# Patient Record
Sex: Female | Born: 1955 | Race: White | Hispanic: No | Marital: Single | Smoking: Never smoker
Health system: Southern US, Community
[De-identification: ages and names within clinical notes are randomized; demographics above are authoritative.]

## PROBLEM LIST (undated history)

## (undated) DIAGNOSIS — E119 Type 2 diabetes mellitus without complications: Secondary | ICD-10-CM

## (undated) DIAGNOSIS — F32A Depression, unspecified: Secondary | ICD-10-CM

## (undated) DIAGNOSIS — C801 Malignant (primary) neoplasm, unspecified: Secondary | ICD-10-CM

## (undated) DIAGNOSIS — D649 Anemia, unspecified: Secondary | ICD-10-CM

## (undated) DIAGNOSIS — F329 Major depressive disorder, single episode, unspecified: Secondary | ICD-10-CM

## (undated) DIAGNOSIS — F419 Anxiety disorder, unspecified: Secondary | ICD-10-CM

## (undated) DIAGNOSIS — H269 Unspecified cataract: Secondary | ICD-10-CM

## (undated) DIAGNOSIS — E079 Disorder of thyroid, unspecified: Secondary | ICD-10-CM

## (undated) DIAGNOSIS — E785 Hyperlipidemia, unspecified: Secondary | ICD-10-CM

## (undated) DIAGNOSIS — T7840XA Allergy, unspecified, initial encounter: Secondary | ICD-10-CM

## (undated) HISTORY — DX: Malignant (primary) neoplasm, unspecified: C80.1

## (undated) HISTORY — PX: HYSTERECTOMY ABDOMINAL WITH SALPINGECTOMY: SHX6725

## (undated) HISTORY — DX: Hyperlipidemia, unspecified: E78.5

## (undated) HISTORY — DX: Major depressive disorder, single episode, unspecified: F32.9

## (undated) HISTORY — DX: Type 2 diabetes mellitus without complications: E11.9

## (undated) HISTORY — DX: Allergy, unspecified, initial encounter: T78.40XA

## (undated) HISTORY — DX: Anxiety disorder, unspecified: F41.9

## (undated) HISTORY — DX: Anemia, unspecified: D64.9

## (undated) HISTORY — DX: Disorder of thyroid, unspecified: E07.9

## (undated) HISTORY — DX: Depression, unspecified: F32.A

## (undated) HISTORY — DX: Unspecified cataract: H26.9

---

## 2017-08-23 ENCOUNTER — Other Ambulatory Visit: Payer: Self-pay | Admitting: Family Medicine

## 2017-08-23 DIAGNOSIS — N6001 Solitary cyst of right breast: Secondary | ICD-10-CM

## 2017-09-22 ENCOUNTER — Ambulatory Visit
Admission: RE | Admit: 2017-09-22 | Discharge: 2017-09-22 | Disposition: A | Discharge status: Home / Self Care | Payer: BC Managed Care – PPO | Source: Ambulatory Visit | Attending: Family Medicine | Admitting: Family Medicine

## 2017-09-22 ENCOUNTER — Ambulatory Visit: Payer: Self-pay

## 2017-09-22 DIAGNOSIS — N6001 Solitary cyst of right breast: Secondary | ICD-10-CM

## 2017-09-28 ENCOUNTER — Telehealth: Payer: Self-pay | Admitting: Gastroenterology

## 2017-09-28 NOTE — Telephone Encounter (Signed)
I reviewed the records.  (For documentation purposes - history of colon cancer in father and brother, both in their 13s.  The patient has had endometrial cancer, it is not clear if she has had genetic testing for Lynch syndrome, though this was mentioned and her most recent colonoscopy report  Colonoscopy report 03/28/2012 with diverticulosis and internal hemorrhoids, no polyps seen)   The patient is due for a colonoscopy, diagnosis: Family history of colon cancer  She can be directly booked through an [facility] nurse visit to be on my schedule. Please make the arrangements.

## 2017-09-28 NOTE — Telephone Encounter (Signed)
Records have been placed on Doctor of the day 08/17/17 Dr.Danis' desk for review.

## 2017-09-29 ENCOUNTER — Ambulatory Visit
Admission: RE | Admit: 2017-09-29 | Discharge: 2017-09-29 | Disposition: A | Discharge status: Home / Self Care | Payer: BC Managed Care – PPO | Source: Ambulatory Visit | Attending: Family Medicine | Admitting: Family Medicine

## 2017-09-29 ENCOUNTER — Encounter: Payer: Self-pay | Admitting: Gastroenterology

## 2017-09-29 DIAGNOSIS — N6001 Solitary cyst of right breast: Secondary | ICD-10-CM

## 2017-09-29 NOTE — Telephone Encounter (Signed)
Left message for patient notifying them of this and to call back and schedule appointment.

## 2017-11-09 ENCOUNTER — Other Ambulatory Visit: Payer: Self-pay

## 2017-11-09 ENCOUNTER — Ambulatory Visit (AMBULATORY_SURGERY_CENTER): Payer: Self-pay

## 2017-11-09 VITALS — Ht 68.0 in | Wt 220.0 lb

## 2017-11-09 DIAGNOSIS — Z800 Family history of malignant neoplasm of digestive organs: Secondary | ICD-10-CM

## 2017-11-09 MED ORDER — PLENVU 140 G PO SOLR: 1.0000 | Freq: Once | ORAL | 0 refills | Status: AC

## 2017-11-09 NOTE — Progress Notes (Signed)
Denies allergies to eggs or soy products. Denies complication of anesthesia or sedation. Denies use of weight loss medication. Denies use of O2.   Emmi instructions given for colonoscopy.  

## 2017-11-10 ENCOUNTER — Telehealth: Payer: Self-pay | Admitting: Gastroenterology

## 2017-11-10 MED ORDER — NA SULFATE-K SULFATE-MG SULF 17.5-3.13-1.6 GM/177ML PO SOLN: 1.0000 | Freq: Once | ORAL | 0 refills | Status: AC

## 2017-11-10 MED ORDER — PEG-KCL-NACL-NASULF-NA ASC-C 140 G PO SOLR: 1.0000 | Freq: Once | ORAL | 0 refills | Status: AC

## 2017-11-10 NOTE — Telephone Encounter (Signed)
plenvu isnt covered at all- called in [facility]- per pharmacist its a zero dollar copay- made new instructions- called pt - we discussed new prep instructions- informed her we will mail these to her, verified address, informed her suprep is no co pay- call us with questions after she gets the new prep -  Lelan Pons PV

## 2017-11-10 NOTE — Telephone Encounter (Signed)
Plenvu prep was resent to [facility] on [address]. Patient was called and notified that it should be available.   Riki Sheer, LPN ( PV )

## 2017-11-10 NOTE — Telephone Encounter (Signed)
Pharmacy calling stating the prep Plenvu is not covered by pt insurance and wanting to know if something else can be called in

## 2017-11-10 NOTE — Addendum Note (Signed)
Addended by: Steva Ready on: 11/10/2017 03:43 PM   Modules accepted: Orders

## 2017-11-27 ENCOUNTER — Encounter: Payer: BC Managed Care – PPO | Admitting: Gastroenterology

## 2017-11-28 ENCOUNTER — Encounter: Payer: Self-pay | Admitting: Gastroenterology

## 2017-12-04 ENCOUNTER — Ambulatory Visit (AMBULATORY_SURGERY_CENTER): Payer: BC Managed Care – PPO | Admitting: Gastroenterology

## 2017-12-04 ENCOUNTER — Encounter: Payer: Self-pay | Admitting: Gastroenterology

## 2017-12-04 ENCOUNTER — Telehealth: Payer: Self-pay | Admitting: Gastroenterology

## 2017-12-04 ENCOUNTER — Other Ambulatory Visit: Payer: Self-pay

## 2017-12-04 VITALS — BP 128/57 | HR 65 | Temp 98.4°F | Resp 15 | Ht 68.0 in | Wt 220.0 lb

## 2017-12-04 DIAGNOSIS — Z1211 Encounter for screening for malignant neoplasm of colon: Secondary | ICD-10-CM | POA: Diagnosis not present

## 2017-12-04 DIAGNOSIS — Z800 Family history of malignant neoplasm of digestive organs: Secondary | ICD-10-CM | POA: Diagnosis present

## 2017-12-04 DIAGNOSIS — Z1212 Encounter for screening for malignant neoplasm of rectum: Secondary | ICD-10-CM

## 2017-12-04 MED ORDER — SODIUM CHLORIDE 0.9 % IV SOLN
500.0000 mL | Freq: Once | INTRAVENOUS | Status: AC
Start: 2017-12-04 — End: ?

## 2017-12-04 NOTE — Progress Notes (Signed)
A and O x3. Report to RN. Tolerated MAC anesthesia well.

## 2017-12-04 NOTE — Progress Notes (Signed)
Pt's states no medical or surgical changes since previsit or office visit.No allergy to soy or eggs. 

## 2017-12-04 NOTE — Patient Instructions (Signed)
   INFORMATION ON DIVERTICULOSIS & HEMORRHOIDS GIVEN TO YOU TODAY  GENETICS APPOINTMENT WILL BE SET UP BY DR Loletha Carrow' OFFICE      YOU HAD AN ENDOSCOPIC PROCEDURE TODAY AT [facility]:   Refer to the procedure report that was given to you for any specific questions about what was found during the examination.  If the procedure report does not answer your questions, please call your gastroenterologist to clarify.  If you requested that your care partner not be given the details of your procedure findings, then the procedure report has been included in a sealed envelope for you to review at your convenience later.  YOU SHOULD EXPECT: Some feelings of bloating in the abdomen. Passage of more gas than usual.  Walking can help get rid of the air that was put into your GI tract during the procedure and reduce the bloating. If you had a lower endoscopy (such as a colonoscopy or flexible sigmoidoscopy) you may notice spotting of blood in your stool or on the toilet paper. If you underwent a bowel prep for your procedure, you may not have a normal bowel movement for a few days.  Please Note:  You might notice some irritation and congestion in your nose or some drainage.  This is from the oxygen used during your procedure.  There is no need for concern and it should clear up in a day or so.  SYMPTOMS TO REPORT IMMEDIATELY:   Following lower endoscopy (colonoscopy or flexible sigmoidoscopy):  Excessive amounts of blood in the stool  Significant tenderness or worsening of abdominal pains  Swelling of the abdomen that is new, acute  Fever of 100F or higher    For urgent or emergent issues, a gastroenterologist can be reached at any hour by calling [phone].   DIET:  We do recommend a small meal at first, but then you may proceed to your regular diet.  Drink plenty of fluids but you should avoid alcoholic beverages for 24 hours.  ACTIVITY:  You should plan to take it easy for the  rest of today and you should NOT DRIVE or use heavy machinery until tomorrow (because of the sedation medicines used during the test).    FOLLOW UP: Our staff will call the number listed on your records the next business day following your procedure to check on you and address any questions or concerns that you may have regarding the information given to you following your procedure. If we do not reach you, we will leave a message.  However, if you are feeling well and you are not experiencing any problems, there is no need to return our call.  We will assume that you have returned to your regular daily activities without incident.  If any biopsies were taken you will be contacted by phone or by letter within the next 1-3 weeks.  Please call us at [phone] if you have not heard about the biopsies in 3 weeks.    SIGNATURES/CONFIDENTIALITY: You and/or your care partner have signed paperwork which will be entered into your electronic medical record.  These signatures attest to the fact that that the information above on your After Visit Summary has been reviewed and is understood.  Full responsibility of the confidentiality of this discharge information lies with you and/or your care-partner.

## 2017-12-04 NOTE — Telephone Encounter (Signed)
Please send a referral to the genetic counselor at [facility] for family history of colon cancer.  - HD

## 2017-12-04 NOTE — Op Note (Signed)
[facility] Patient Name: Alejandra Wright Procedure Date: 12/04/2017 8:50 AM MRN: [id number] Endoscopist: [place]. Loletha Carrow , MD Age: 62 Referring MD:  Date of Birth: 1956-01-22 Gender: Female Account #: [id number] Procedure:                Colonoscopy Indications:              Screening patient at increased risk: Colorectal                            cancer in multiple 1st-degree relatives before age                            12 years (father and brother). patient has also had                            endometrial cancer Medicines:                Monitored Anesthesia Care Procedure:                Pre-Anesthesia Assessment:                           - Prior to the procedure, a History and Physical                            was performed, and patient medications and                            allergies were reviewed. The patient's tolerance of                            previous anesthesia was also reviewed. The risks                            and benefits of the procedure and the sedation                            options and risks were discussed with the patient.                            All questions were answered, and informed consent                            was obtained. Prior Anticoagulants: The patient has                            taken no previous anticoagulant or antiplatelet                            agents. ASA Grade Assessment: II - A patient with                            mild systemic disease. After reviewing the risks  and benefits, the patient was deemed in                            satisfactory condition to undergo the procedure.                           After obtaining informed consent, the colonoscope                            was passed under direct vision. Throughout the                            procedure, the patient's blood pressure, pulse, and                            oxygen saturations were monitored  continuously. The                            Colonoscope was introduced through the anus and                            advanced to the the cecum, identified by                            appendiceal orifice and ileocecal valve. The                            colonoscopy was performed without difficulty. The                            patient tolerated the procedure well. The quality                            of the bowel preparation was excellent. The                            ileocecal valve, appendiceal orifice, and rectum                            were photographed. The quality of the bowel                            preparation was evaluated using the BBPS [facility]                            Bowel Preparation Scale) with scores of: Right                            Colon = 3, Transverse Colon = 3 and Left Colon = 3                            (entire mucosa seen well with no residual staining,  small fragments of stool or opaque liquid). The                            total BBPS score equals 9. The bowel preparation                            used was Plenvu. Scope In: 8:59:57 AM Scope Out: 9:13:49 AM Scope Withdrawal Time: 0 hours 10 minutes 21 seconds  Total Procedure Duration: 0 hours 13 minutes 52 seconds  Findings:                 The perianal and digital rectal examinations were                            normal.                           Multiple diverticula were found in the left colon.                           Internal hemorrhoids were found. The hemorrhoids                            were Grade I (internal hemorrhoids that do not                            prolapse).                           The exam was otherwise without abnormality on                            direct and retroflexion views. Complications:            No immediate complications. Estimated Blood Loss:     Estimated blood loss: none. Impression:               - Diverticulosis in  the left colon.                           - Internal hemorrhoids.                           - The examination was otherwise normal on direct                            and retroflexion views.                           - No specimens collected. Recommendation:           - Patient has a contact number available for                            emergencies. The signs and symptoms of potential                            delayed complications were  discussed with the                            patient. Return to normal activities tomorrow.                            Written discharge instructions were provided to the                            patient.                           - Resume previous diet.                           - Continue present medications.                           - Repeat colonoscopy in 5 years for screening                            purposes (unless results of genetic testing                            indicate that it should be sooner).                           - Refer to a genetics counselor at appointment to                            be scheduled. Henry L. Loletha Carrow, MD 12/04/2017 9:18:11 AM This report has been signed electronically.

## 2017-12-05 ENCOUNTER — Telehealth: Payer: Self-pay | Admitting: *Deleted

## 2017-12-05 NOTE — Telephone Encounter (Signed)
Attempted second follow up call, no answer and no answering machine.

## 2017-12-05 NOTE — Telephone Encounter (Signed)
Attempted to call patient for post procedure call back will attempt to call back later this afternoon. Sm

## 2017-12-06 ENCOUNTER — Other Ambulatory Visit: Payer: Self-pay

## 2017-12-06 DIAGNOSIS — Z800 Family history of malignant neoplasm of digestive organs: Secondary | ICD-10-CM

## 2017-12-06 NOTE — Telephone Encounter (Signed)
Referral sent to genetic counseling. Pt has been notified. Gave her the phone number to call them if she doesn't hear from them in a weeks or so.

## 2017-12-07 ENCOUNTER — Telehealth: Payer: Self-pay | Admitting: Genetics

## 2017-12-08 ENCOUNTER — Telehealth: Payer: Self-pay | Admitting: Gastroenterology

## 2017-12-08 NOTE — Telephone Encounter (Signed)
Understood 

## 2017-12-08 NOTE — Telephone Encounter (Signed)
Patient called with questions concerning insurance coverage for genetic testing as well as concerns about who owns the DNA/data after testing.  We discussed the labs privacy policy, informed her that typically health insurance coverers genetic testing when you meet national criteria (which she appears to meet).  She also expressed concerns regarding genetic discrimination.  I discussed the law [place] with her in detail, and answered questions about colon cancer risk/Lynch Syndrome.  I emailed information about [place], [facility]'s privacy policy, Colon cancer genetic testing/lynch syndrome, and a link to our website to read more about genetic counseling.  At this time she is undecided if she would like to pursue genetic counseling or not.  I told her if she does want to come for genetic counseling, we can discuss these topics further and would not actually order genetic testing unless she was comfortable.  She says she will reach out if she has questions or would like to schedule an appointment.

## 2017-12-08 NOTE — Telephone Encounter (Signed)
FYI

## 2017-12-11 NOTE — Telephone Encounter (Signed)
Pt declined the appointment and the referral to genetics counselor. Referral was closed.

## 2017-12-14 ENCOUNTER — Other Ambulatory Visit: Payer: Self-pay | Admitting: Family Medicine

## 2017-12-14 DIAGNOSIS — Z1231 Encounter for screening mammogram for malignant neoplasm of breast: Secondary | ICD-10-CM

## 2018-01-08 ENCOUNTER — Ambulatory Visit
Admission: RE | Admit: 2018-01-08 | Discharge: 2018-01-08 | Disposition: A | Discharge status: Home / Self Care | Payer: BC Managed Care – PPO | Source: Ambulatory Visit | Attending: Family Medicine | Admitting: Family Medicine

## 2018-01-08 DIAGNOSIS — Z1231 Encounter for screening mammogram for malignant neoplasm of breast: Secondary | ICD-10-CM

## 2018-08-21 ENCOUNTER — Other Ambulatory Visit: Payer: Self-pay | Admitting: Family Medicine

## 2018-08-21 DIAGNOSIS — Z1231 Encounter for screening mammogram for malignant neoplasm of breast: Secondary | ICD-10-CM

## 2019-01-14 ENCOUNTER — Ambulatory Visit: Payer: BC Managed Care – PPO

## 2019-02-06 ENCOUNTER — Ambulatory Visit: Payer: BC Managed Care – PPO

## 2019-03-12 ENCOUNTER — Ambulatory Visit: Payer: BC Managed Care – PPO

## 2019-04-24 ENCOUNTER — Ambulatory Visit: Payer: BC Managed Care – PPO

## 2019-05-18 ENCOUNTER — Ambulatory Visit: Payer: BC Managed Care – PPO

## 2019-06-20 ENCOUNTER — Other Ambulatory Visit: Payer: Self-pay

## 2019-06-20 ENCOUNTER — Ambulatory Visit
Admission: RE | Admit: 2019-06-20 | Discharge: 2019-06-20 | Disposition: A | Discharge status: Home / Self Care | Payer: BC Managed Care – PPO | Source: Ambulatory Visit | Attending: Family Medicine | Admitting: Family Medicine

## 2019-06-20 DIAGNOSIS — Z1231 Encounter for screening mammogram for malignant neoplasm of breast: Secondary | ICD-10-CM

## 2020-04-30 ENCOUNTER — Ambulatory Visit: Payer: BC Managed Care – PPO | Admitting: Podiatry

## 2020-05-14 ENCOUNTER — Ambulatory Visit: Payer: BC Managed Care – PPO | Admitting: Podiatry

## 2020-07-24 ENCOUNTER — Other Ambulatory Visit: Payer: Self-pay | Admitting: Family Medicine

## 2020-07-24 DIAGNOSIS — Z1231 Encounter for screening mammogram for malignant neoplasm of breast: Secondary | ICD-10-CM

## 2020-08-05 ENCOUNTER — Other Ambulatory Visit: Payer: Self-pay

## 2020-08-05 ENCOUNTER — Ambulatory Visit
Admission: RE | Admit: 2020-08-05 | Discharge: 2020-08-05 | Disposition: A | Discharge status: Home / Self Care | Payer: BC Managed Care – PPO | Source: Ambulatory Visit | Attending: Family Medicine | Admitting: Family Medicine

## 2020-08-05 DIAGNOSIS — Z1231 Encounter for screening mammogram for malignant neoplasm of breast: Secondary | ICD-10-CM

## 2020-08-10 ENCOUNTER — Other Ambulatory Visit: Payer: Self-pay | Admitting: Family Medicine

## 2020-08-10 DIAGNOSIS — R928 Other abnormal and inconclusive findings on diagnostic imaging of breast: Secondary | ICD-10-CM

## 2020-08-20 ENCOUNTER — Ambulatory Visit
Admission: RE | Admit: 2020-08-20 | Discharge: 2020-08-20 | Disposition: A | Discharge status: Home / Self Care | Payer: BC Managed Care – PPO | Source: Ambulatory Visit | Attending: Family Medicine | Admitting: Family Medicine

## 2020-08-20 ENCOUNTER — Other Ambulatory Visit: Payer: Self-pay | Admitting: Family Medicine

## 2020-08-20 ENCOUNTER — Other Ambulatory Visit: Payer: Self-pay

## 2020-08-20 DIAGNOSIS — R921 Mammographic calcification found on diagnostic imaging of breast: Secondary | ICD-10-CM

## 2020-08-20 DIAGNOSIS — R928 Other abnormal and inconclusive findings on diagnostic imaging of breast: Secondary | ICD-10-CM

## 2021-02-17 ENCOUNTER — Ambulatory Visit
Admission: RE | Admit: 2021-02-17 | Discharge: 2021-02-17 | Disposition: A | Discharge status: Home / Self Care | Payer: BC Managed Care – PPO | Source: Ambulatory Visit | Attending: Family Medicine | Admitting: Family Medicine

## 2021-02-17 ENCOUNTER — Other Ambulatory Visit: Payer: Self-pay

## 2021-02-17 ENCOUNTER — Other Ambulatory Visit: Payer: Self-pay | Admitting: Family Medicine

## 2021-02-17 DIAGNOSIS — R921 Mammographic calcification found on diagnostic imaging of breast: Secondary | ICD-10-CM

## 2021-07-28 ENCOUNTER — Other Ambulatory Visit: Payer: Self-pay | Admitting: Family Medicine

## 2021-07-28 DIAGNOSIS — R921 Mammographic calcification found on diagnostic imaging of breast: Secondary | ICD-10-CM

## 2021-08-24 ENCOUNTER — Ambulatory Visit
Admission: RE | Admit: 2021-08-24 | Discharge: 2021-08-24 | Disposition: A | Discharge status: Home / Self Care | Payer: BC Managed Care – PPO | Source: Ambulatory Visit | Attending: Family Medicine | Admitting: Family Medicine

## 2021-08-24 ENCOUNTER — Other Ambulatory Visit: Payer: Self-pay

## 2021-08-24 DIAGNOSIS — R921 Mammographic calcification found on diagnostic imaging of breast: Secondary | ICD-10-CM

## 2021-09-29 ENCOUNTER — Other Ambulatory Visit: Payer: Self-pay | Admitting: Family Medicine

## 2021-09-29 DIAGNOSIS — E2839 Other primary ovarian failure: Secondary | ICD-10-CM

## 2022-01-28 ENCOUNTER — Other Ambulatory Visit: Payer: Self-pay | Admitting: Family Medicine

## 2022-01-28 DIAGNOSIS — E2839 Other primary ovarian failure: Secondary | ICD-10-CM

## 2022-02-02 IMAGING — MG MM DIGITAL DIAGNOSTIC UNILAT*R* W/ TOMO W/ CAD
8 of 12 series · 8 of 28 positions shown · non-contrast
Comparison: Previous exam(s).

CLINICAL DATA: 64-year-old female presenting for six-month
follow-up of probably benign right breast calcifications.

EXAM:
DIGITAL DIAGNOSTIC UNILATERAL RIGHT MAMMOGRAM WITH TOMOSYNTHESIS AND
CAD; ULTRASOUND RIGHT BREAST LIMITED
TECHNIQUE: Right digital diagnostic mammography and breast tomosynthesis was
performed. The images were evaluated with computer-aided detection.;
Targeted ultrasound examination of the right breast was performed

[R CC (1 of 3)]
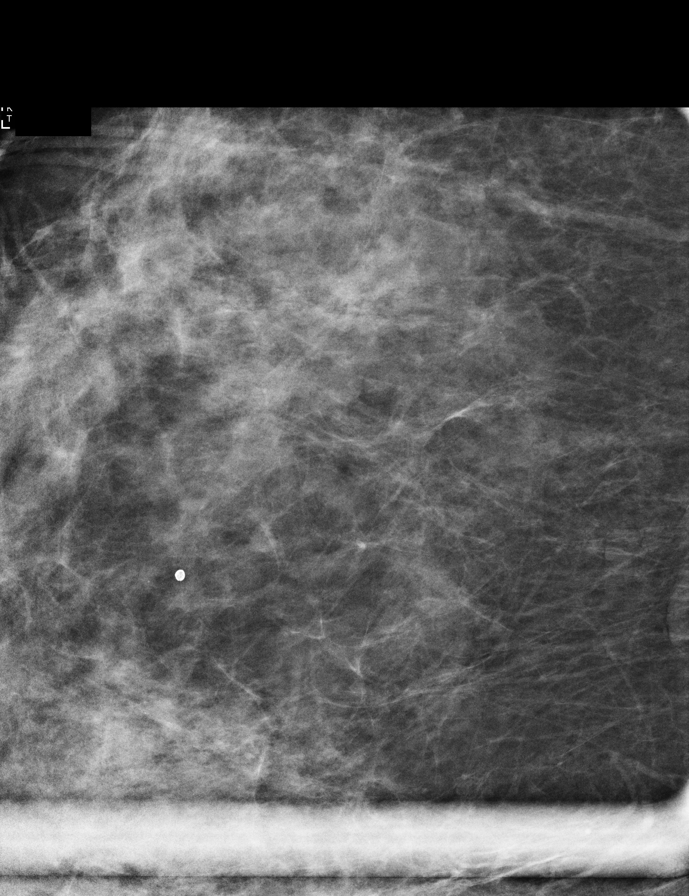

[R CC (2 of 3)]
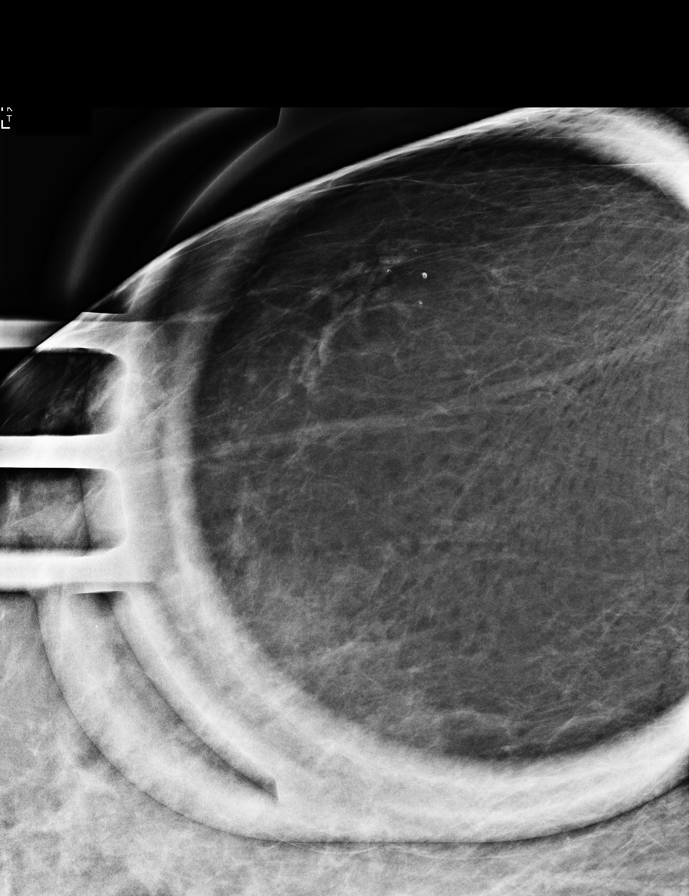

[R ML]
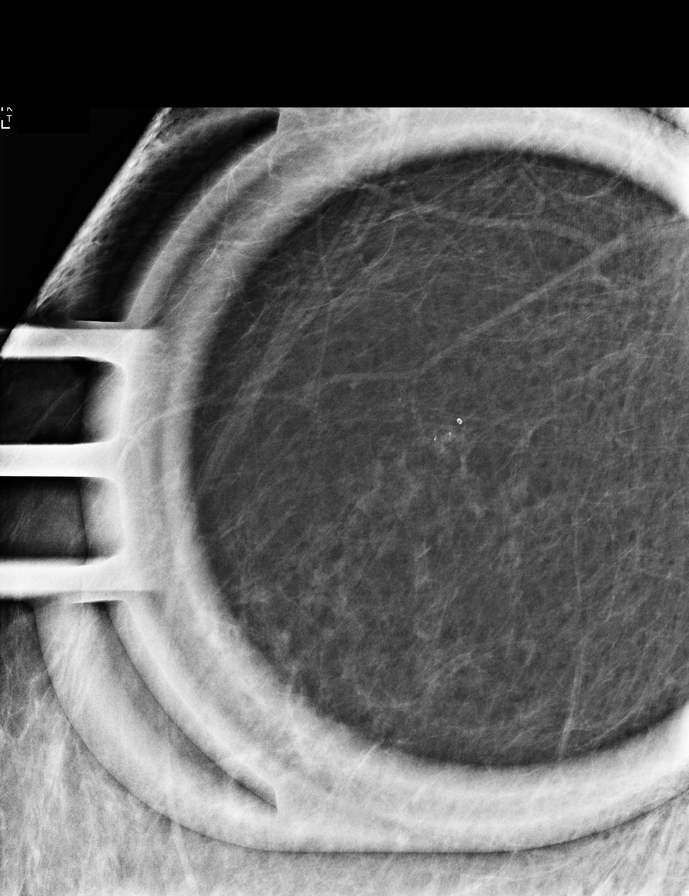

[R CC (3 of 3)]
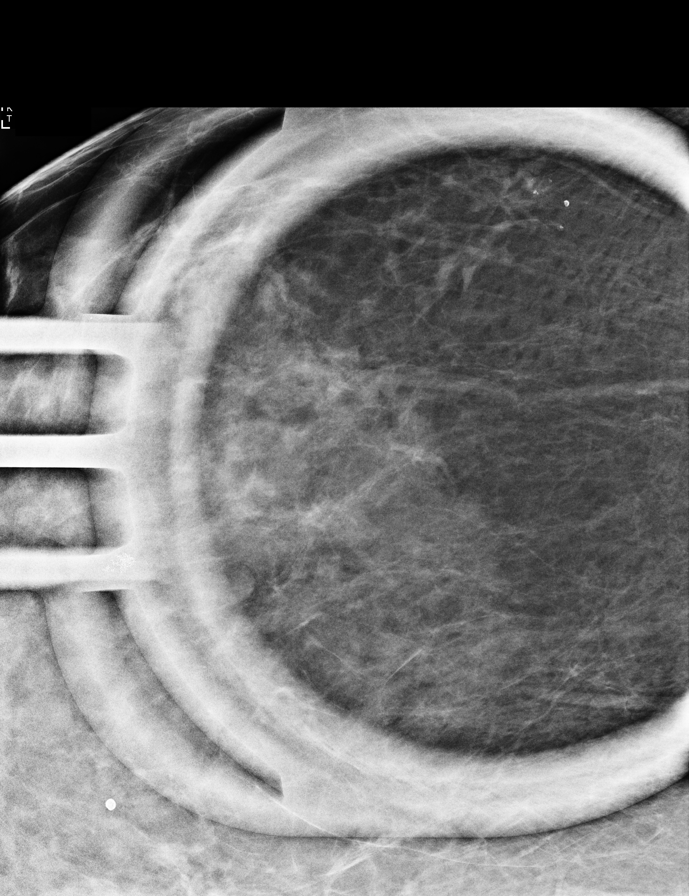

[R MLO synth-2D (1 of 2)]
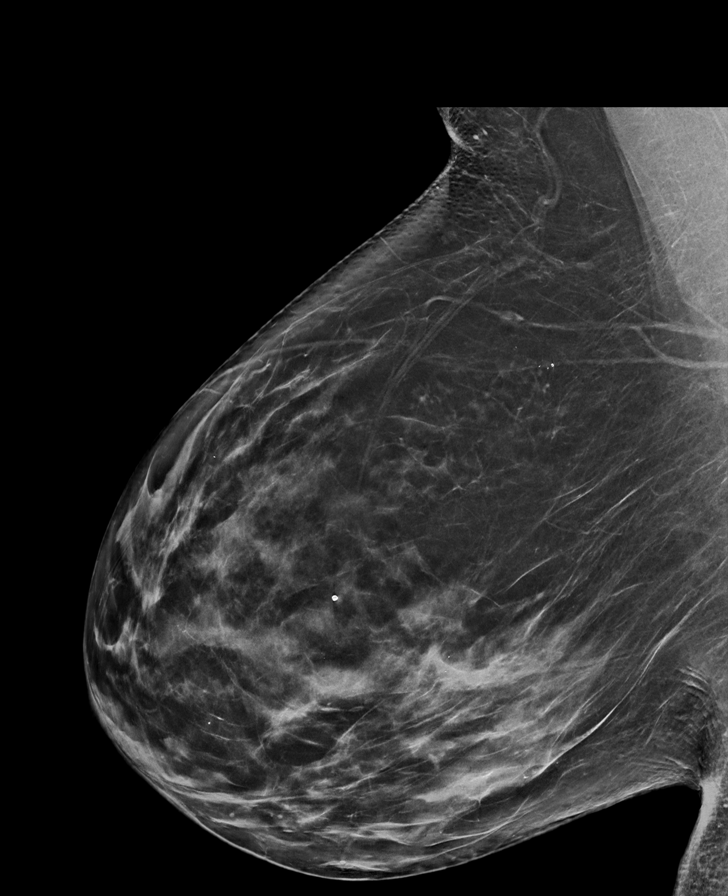

[R CC synth-2D (1 of 2)]
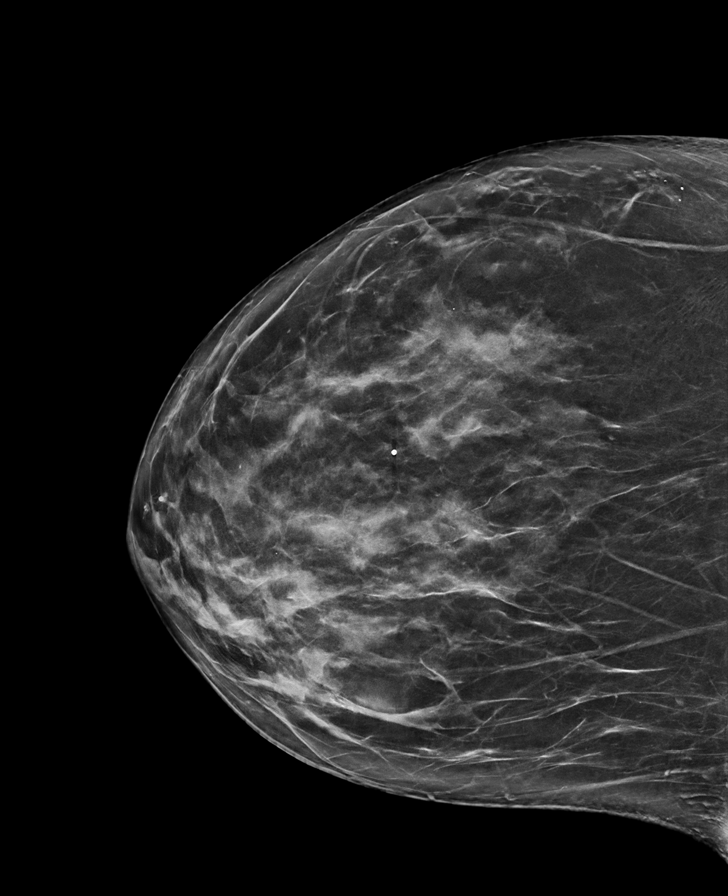

[R MLO synth-2D (2 of 2)]
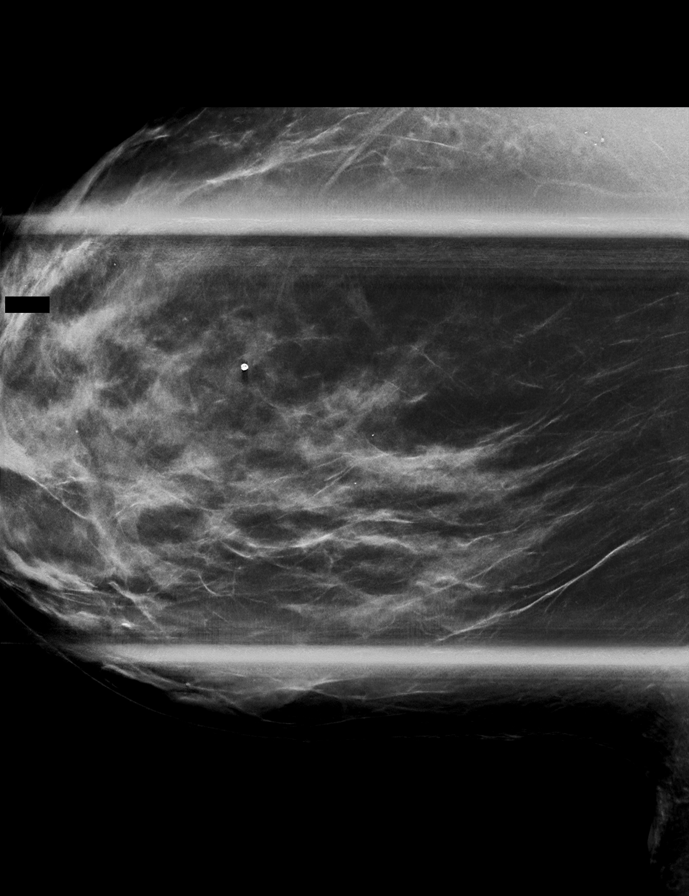

[R CC synth-2D (2 of 2)]
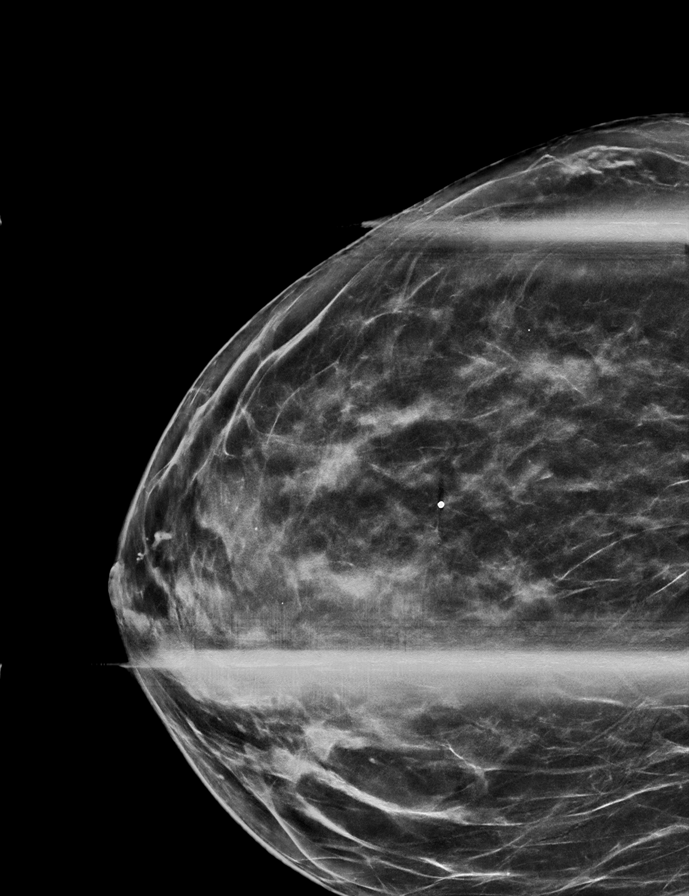

[8 of 28 positions shown; findings below may reference images not displayed]

ACR Breast Density Category c: The breast tissue is heterogeneously
dense, which may obscure small masses.
FINDINGS: Mammogram:

Full field tomosynthesis, spot 2D magnification and spot compression
tomosynthesis views of the right breast were performed. There are
stable loosely grouped predominantly punctate calcifications in the
upper outer quadrant posterior depth. No new suspicious linear or
branching forms. No new associated mass.

In the outer right breast posterior depth there is a possible
obscured mass which does not persist on the additional spot imaging.

Ultrasound:

Targeted ultrasound performed throughout the outer aspect of the
right breast at 9 o'clock 10 cm from the nipple demonstrating an
oval circumscribed anechoic mass measuring 0.6 x 0.4 x 0.7 cm,
consistent with a benign simple cyst. This likely corresponds to the
mass identified mammographically.
IMPRESSION: 1. Stable probably benign calcifications in the upper outer right
breast.

2.  Benign simple cyst in the outer right breast.

RECOMMENDATION:
Diagnostic bilateral mammogram and in 6 months.

I have discussed the findings and recommendations with the patient.
If applicable, a reminder letter will be sent to the patient
regarding the next appointment.

BI-RADS CATEGORY  3: Probably benign.

## 2022-03-17 ENCOUNTER — Other Ambulatory Visit: Payer: BC Managed Care – PPO

## 2022-05-12 ENCOUNTER — Ambulatory Visit
Admission: RE | Admit: 2022-05-12 | Discharge: 2022-05-12 | Disposition: A | Discharge status: Home / Self Care | Payer: BC Managed Care – PPO | Source: Ambulatory Visit | Attending: Family Medicine | Admitting: Family Medicine

## 2022-05-12 DIAGNOSIS — E2839 Other primary ovarian failure: Secondary | ICD-10-CM

## 2022-09-09 ENCOUNTER — Other Ambulatory Visit: Payer: Self-pay | Admitting: Family Medicine

## 2022-09-09 DIAGNOSIS — Z139 Encounter for screening, unspecified: Secondary | ICD-10-CM

## 2022-11-01 ENCOUNTER — Ambulatory Visit: Payer: BC Managed Care – PPO

## 2022-12-07 ENCOUNTER — Encounter: Payer: Self-pay | Admitting: Gastroenterology

## 2023-01-03 ENCOUNTER — Ambulatory Visit: Payer: BC Managed Care – PPO

## 2023-01-16 ENCOUNTER — Ambulatory Visit
Admission: RE | Admit: 2023-01-16 | Discharge: 2023-01-16 | Disposition: A | Discharge status: Home / Self Care | Payer: BC Managed Care – PPO | Source: Ambulatory Visit | Attending: Family Medicine | Admitting: Family Medicine

## 2023-01-16 DIAGNOSIS — Z139 Encounter for screening, unspecified: Secondary | ICD-10-CM

## 2023-01-19 ENCOUNTER — Other Ambulatory Visit: Payer: Self-pay | Admitting: Family Medicine

## 2023-01-19 DIAGNOSIS — R921 Mammographic calcification found on diagnostic imaging of breast: Secondary | ICD-10-CM

## 2023-01-30 ENCOUNTER — Ambulatory Visit
Admission: RE | Admit: 2023-01-30 | Discharge: 2023-01-30 | Disposition: A | Discharge status: Home / Self Care | Payer: Medicare Other | Source: Ambulatory Visit | Attending: Family Medicine | Admitting: Family Medicine

## 2023-01-30 DIAGNOSIS — R921 Mammographic calcification found on diagnostic imaging of breast: Secondary | ICD-10-CM
